# Patient Record
Sex: Female | Born: 1935 | Race: White | Hispanic: No
Health system: Southern US, Community
[De-identification: ages and names within clinical notes are randomized; demographics above are authoritative.]

---

## 2009-05-30 ENCOUNTER — Inpatient Hospital Stay: Payer: Self-pay | Admitting: Specialist

## 2010-06-17 ENCOUNTER — Emergency Department: Payer: Self-pay | Admitting: Emergency Medicine

## 2010-06-27 ENCOUNTER — Emergency Department: Payer: Self-pay

## 2011-06-16 ENCOUNTER — Emergency Department: Payer: Self-pay | Admitting: Unknown Physician Specialty

## 2011-10-11 ENCOUNTER — Emergency Department: Payer: Self-pay | Admitting: Emergency Medicine

## 2011-10-12 LAB — URINALYSIS, COMPLETE
Bacteria: NONE SEEN
Blood: NEGATIVE
Leukocyte Esterase: NEGATIVE
Nitrite: NEGATIVE
Ph: 5 (ref 4.5–8.0)
RBC,UR: 1 /HPF (ref 0–5)

## 2011-10-12 LAB — BASIC METABOLIC PANEL
Chloride: 106 mmol/L (ref 98–107)
Co2: 30 mmol/L (ref 21–32)
Osmolality: 289 (ref 275–301)
Potassium: 3.9 mmol/L (ref 3.5–5.1)
Sodium: 144 mmol/L (ref 136–145)

## 2011-10-12 LAB — CBC
MCHC: 32.7 g/dL (ref 32.0–36.0)
MCV: 96 fL (ref 80–100)
Platelet: 224 10*3/uL (ref 150–440)
RBC: 4.44 10*6/uL (ref 3.80–5.20)

## 2011-10-14 LAB — URINE CULTURE

## 2011-11-20 ENCOUNTER — Emergency Department: Payer: Self-pay | Admitting: *Deleted

## 2011-12-10 ENCOUNTER — Emergency Department: Payer: Self-pay | Admitting: Emergency Medicine

## 2011-12-10 LAB — URINALYSIS, COMPLETE
Bilirubin,UR: NEGATIVE
Blood: NEGATIVE
Glucose,UR: NEGATIVE mg/dL (ref 0–75)
Nitrite: POSITIVE
Ph: 7 (ref 4.5–8.0)
RBC,UR: 1 /HPF (ref 0–5)
Specific Gravity: 1.008 (ref 1.003–1.030)

## 2011-12-13 LAB — URINE CULTURE

## 2011-12-19 ENCOUNTER — Inpatient Hospital Stay: Payer: Self-pay | Admitting: Internal Medicine

## 2011-12-19 LAB — URINALYSIS, COMPLETE
Bilirubin,UR: NEGATIVE
Protein: 30
Specific Gravity: 1.021 (ref 1.003–1.030)
WBC UR: 666 /HPF (ref 0–5)

## 2011-12-19 LAB — CBC
HCT: 42.3 % (ref 35.0–47.0)
MCH: 31.6 pg (ref 26.0–34.0)
MCHC: 32.1 g/dL (ref 32.0–36.0)
MCV: 98 fL (ref 80–100)
Platelet: 195 10*3/uL (ref 150–440)

## 2011-12-19 LAB — COMPREHENSIVE METABOLIC PANEL
Albumin: 3.5 g/dL (ref 3.4–5.0)
Alkaline Phosphatase: 83 U/L (ref 50–136)
Bilirubin,Total: 0.8 mg/dL (ref 0.2–1.0)
Calcium, Total: 9.1 mg/dL (ref 8.5–10.1)
Chloride: 108 mmol/L — ABNORMAL HIGH (ref 98–107)
Creatinine: 0.83 mg/dL (ref 0.60–1.30)
EGFR (African American): >60
EGFR (Non-African Amer.): >60
Potassium: 4 mmol/L (ref 3.5–5.1)
SGOT(AST): 31 U/L (ref 15–37)
Sodium: 141 mmol/L (ref 136–145)

## 2011-12-19 LAB — CK TOTAL AND CKMB (NOT AT ARMC)
CK, Total: 111 U/L (ref 21–215)
CK-MB: 2.2 ng/mL (ref 0.5–3.6)

## 2011-12-19 LAB — PROTIME-INR: INR: 1

## 2011-12-20 LAB — CBC WITH DIFFERENTIAL/PLATELET
Basophil %: 0.5 %
Eosinophil #: 0.1 10*3/uL (ref 0.0–0.7)
Eosinophil %: 2.2 %
HGB: 12.9 g/dL (ref 12.0–16.0)
Lymphocyte %: 31.4 %
MCHC: 33.1 g/dL (ref 32.0–36.0)
MCV: 97 fL (ref 80–100)
Monocyte %: 9.2 %
Neutrophil #: 3.7 10*3/uL (ref 1.4–6.5)
Neutrophil %: 56.7 %
Platelet: 183 10*3/uL (ref 150–440)
RBC: 4.03 10*6/uL (ref 3.80–5.20)
RDW: 13.2 % (ref 11.5–14.5)
WBC: 6.6 10*3/uL (ref 3.6–11.0)

## 2011-12-20 LAB — BASIC METABOLIC PANEL
Calcium, Total: 8.9 mg/dL (ref 8.5–10.1)
Co2: 26 mmol/L (ref 21–32)
Glucose: 83 mg/dL (ref 65–99)
Osmolality: 293 (ref 275–301)
Sodium: 147 mmol/L — ABNORMAL HIGH (ref 136–145)

## 2011-12-22 LAB — CBC WITH DIFFERENTIAL/PLATELET
Basophil #: 0 10*3/uL (ref 0.0–0.1)
Basophil %: 0.5 %
Eosinophil #: 0.2 10*3/uL (ref 0.0–0.7)
Lymphocyte #: 1.6 10*3/uL (ref 1.0–3.6)
Lymphocyte %: 26.4 %
MCH: 31.9 pg (ref 26.0–34.0)
MCHC: 33.4 g/dL (ref 32.0–36.0)
MCV: 96 fL (ref 80–100)
Monocyte #: 0.6 x10 3/mm (ref 0.2–0.9)
Neutrophil #: 3.6 10*3/uL (ref 1.4–6.5)
Neutrophil %: 60.1 %
Platelet: 173 10*3/uL (ref 150–440)
RDW: 12.9 % (ref 11.5–14.5)
WBC: 6 10*3/uL (ref 3.6–11.0)

## 2011-12-22 LAB — BASIC METABOLIC PANEL
Calcium, Total: 8.5 mg/dL (ref 8.5–10.1)
Chloride: 111 mmol/L — ABNORMAL HIGH (ref 98–107)
Co2: 20 mmol/L — ABNORMAL LOW (ref 21–32)
EGFR (African American): >60
EGFR (Non-African Amer.): >60
Glucose: 70 mg/dL (ref 65–99)
Sodium: 144 mmol/L (ref 136–145)

## 2011-12-22 LAB — URINE CULTURE

## 2011-12-23 LAB — BASIC METABOLIC PANEL
Anion Gap: 10 (ref 7–16)
BUN: 10 mg/dL (ref 7–18)
Calcium, Total: 9.1 mg/dL (ref 8.5–10.1)
Chloride: 108 mmol/L — ABNORMAL HIGH (ref 98–107)
Creatinine: 0.69 mg/dL (ref 0.60–1.30)
EGFR (Non-African Amer.): >60
Glucose: 97 mg/dL (ref 65–99)
Osmolality: 282 (ref 275–301)
Sodium: 142 mmol/L (ref 136–145)

## 2011-12-23 LAB — MAGNESIUM: Magnesium: 1.9 mg/dL

## 2011-12-24 LAB — CULTURE, BLOOD (SINGLE)

## 2013-01-04 ENCOUNTER — Ambulatory Visit: Payer: Self-pay | Admitting: Nurse Practitioner

## 2013-02-04 ENCOUNTER — Ambulatory Visit: Payer: Self-pay | Admitting: Nurse Practitioner

## 2013-05-07 DEATH — deceased

## 2013-09-28 IMAGING — CR PELVIS - 1-2 VIEW
1 series · 1 of 1 positions shown · non-contrast
Comparison: None

REASON FOR EXAM: fall, left leg shorter than right.
COMMENTS:

PROCEDURE:     DXR - DXR PELVIS AP ONLY  - November 20, 2011  [DATE]
RESULT:     History: Fall

[t pelvis ap]
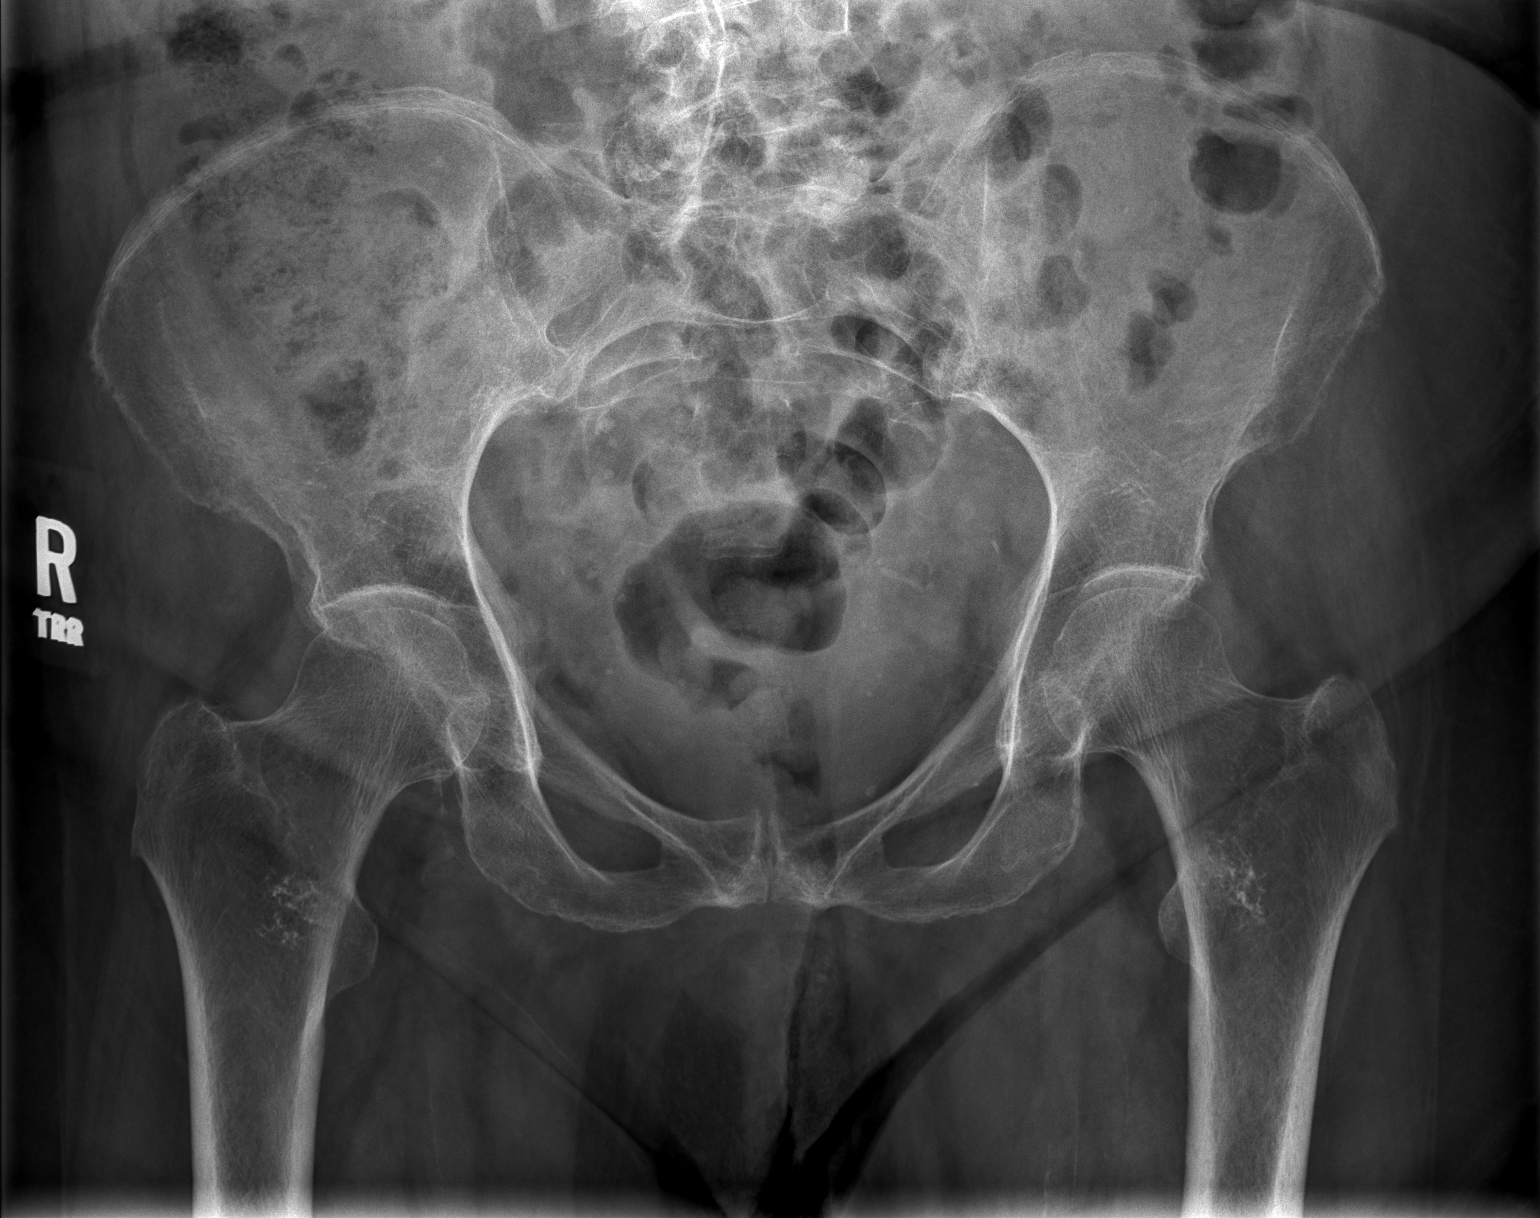

[1 of 1 positions shown; findings below may reference images not displayed]

FINDINGS: AP pelvis demonstrates no fracture or dislocation. The joint spaces are
maintained. The sacroiliac joints are unremarkable. There degenerative
changes of the pubic symphysis. There is generalized osteopenia.
IMPRESSION: No acute osseous injury of the pelvis. Given the patient's age and
osteopenia, if there is further clinical concern for a hip fracture, a
screening MRI of the hip is recommended for increased sensitivity.

## 2013-09-28 IMAGING — CT CT HEAD WITHOUT CONTRAST
2 series · 15 of 30 positions shown, 19 images · non-contrast
Comparison: none

REASON FOR EXAM: fall, hematoma to forehead
COMMENTS:

PROCEDURE:     CT  - CT HEAD WITHOUT CONTRAST  - November 20, 2011  [DATE]
RESULT:     Comparison:  None
TECHNIQUE: Multiple axial images from the foramen magnum to the vertex were
obtained without IV contrast.

[Series 2: without · axial · non-contrast · 0.44mm/px · z∈[-192,-62]mm · 13 of 32 slices shown, 17 images]
[im 3/32  brain]
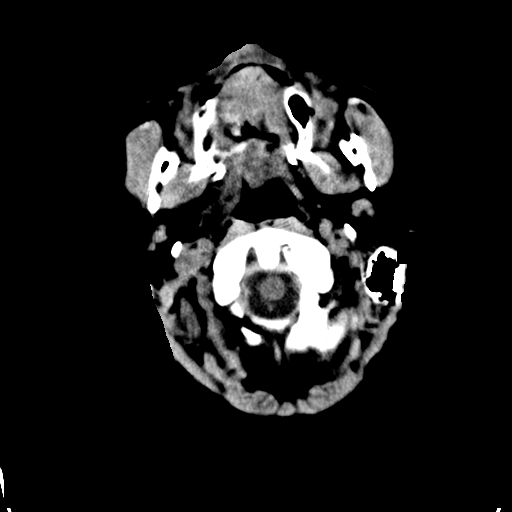
[im 3/32  bone]
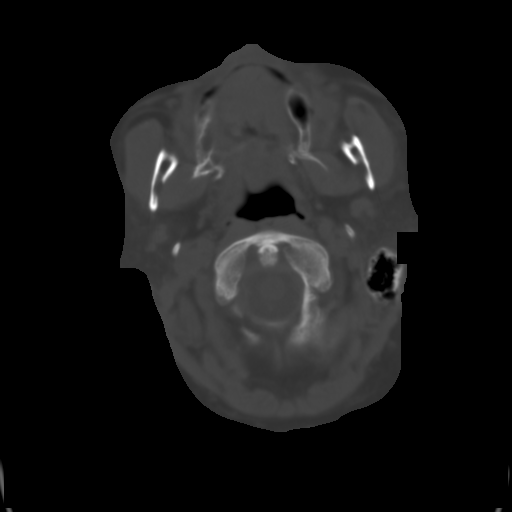
[im 5/32  brain]
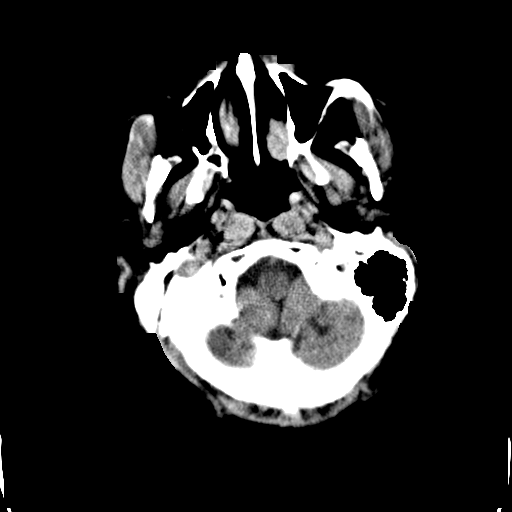
[im 7/32  brain]
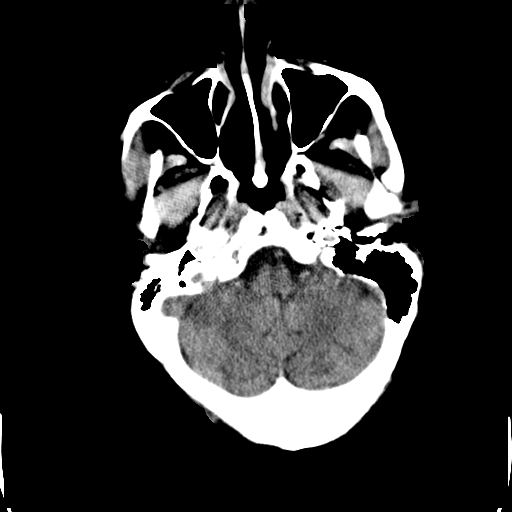
[im 9/32  brain]
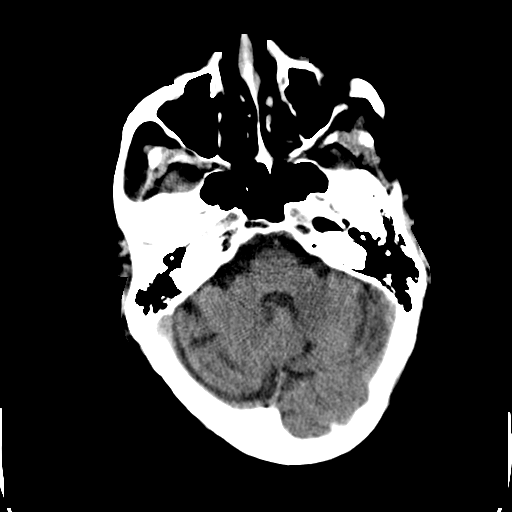
[im 12/32  brain]
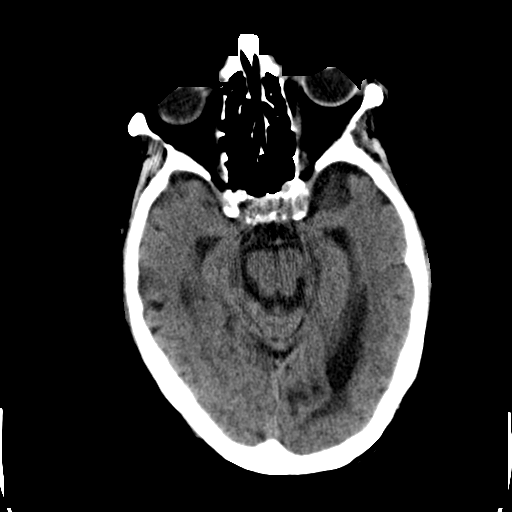
[im 12/32  bone]
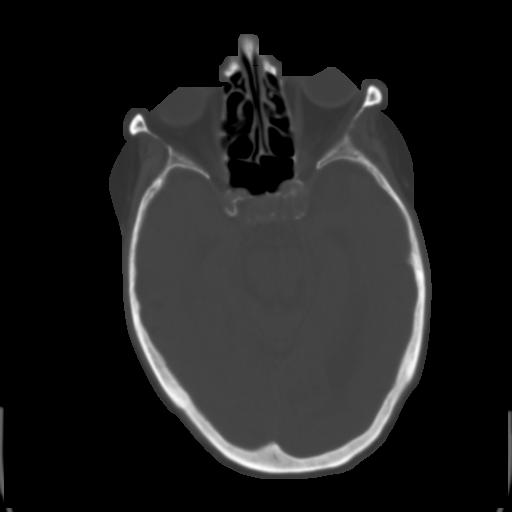
[im 14/32  brain]
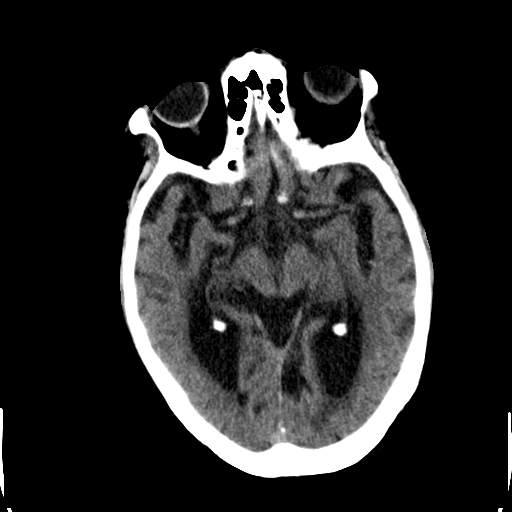
[im 16/32  brain]
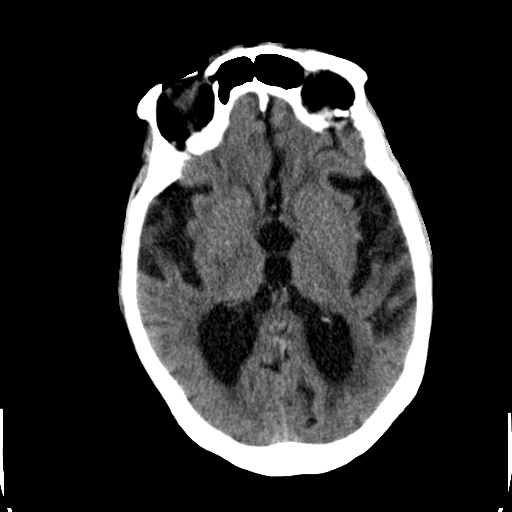
[im 18/32  brain]
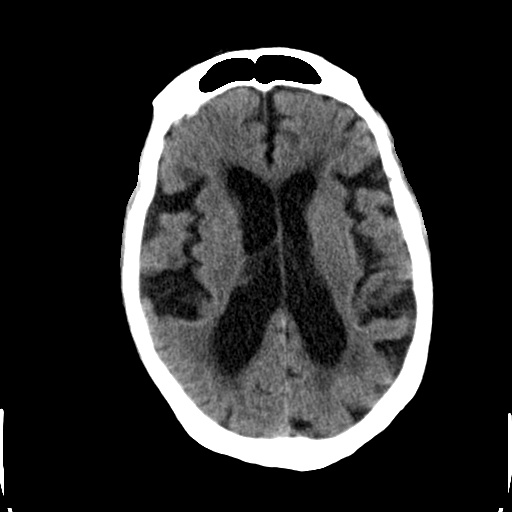
[im 20/32  brain]
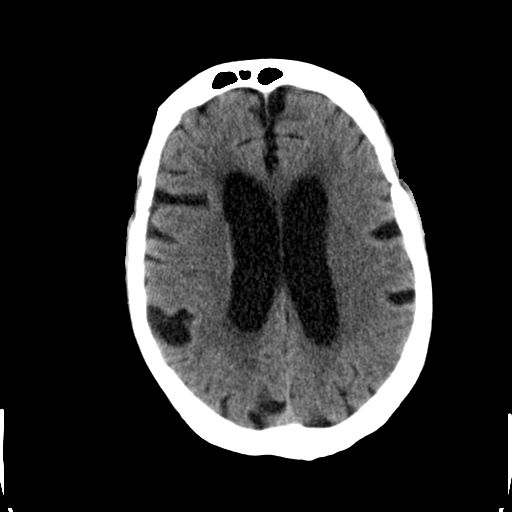
[im 20/32  bone]
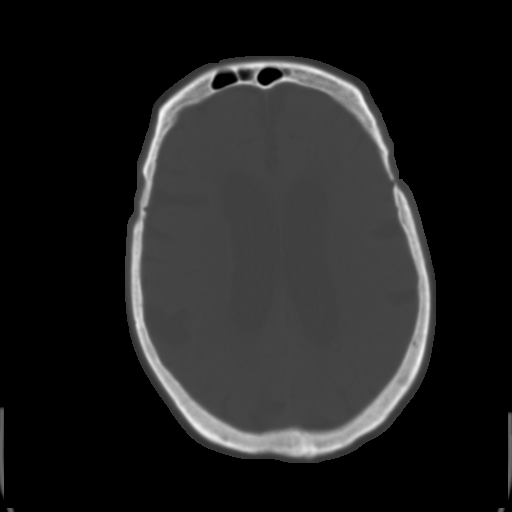
[im 23/32  brain]
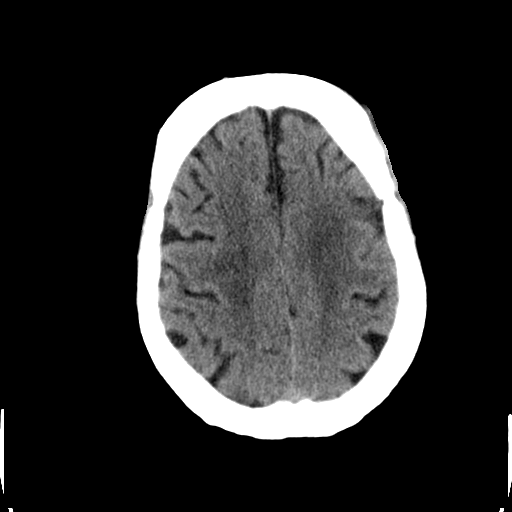
[im 25/32  brain]
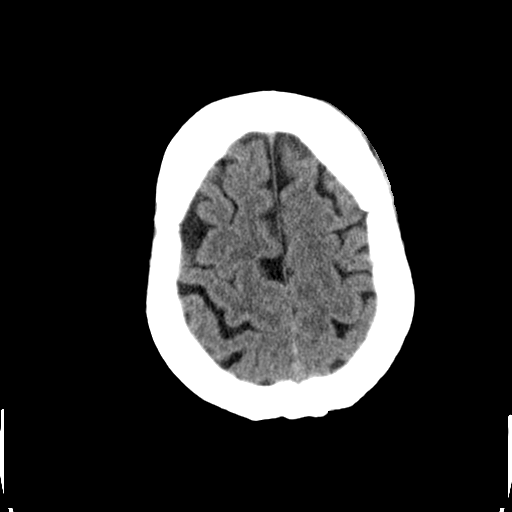
[im 27/32  brain]
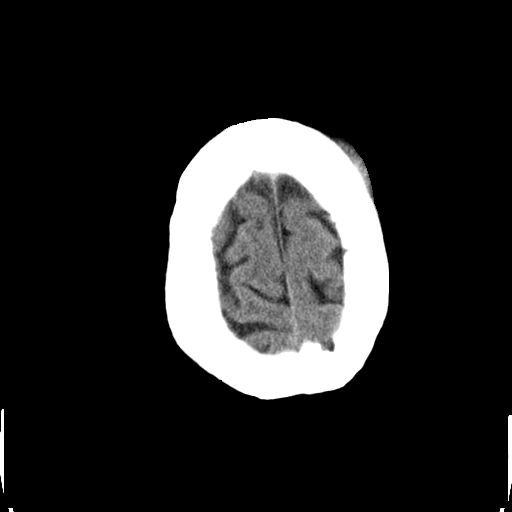
[im 29/32  brain]
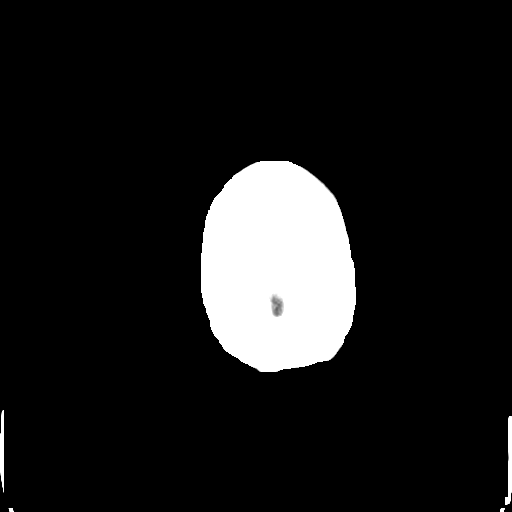
[im 29/32  bone]
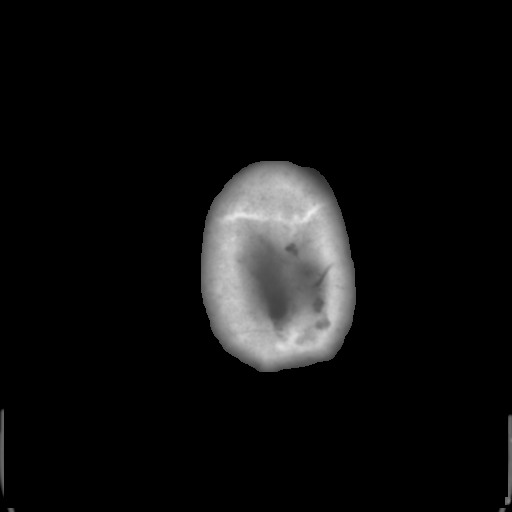

[Series 3: bone · axial · 0.44mm/px · z∈[-192,-172]mm · 2 of 32 slices shown]
[im 3/32  bone]
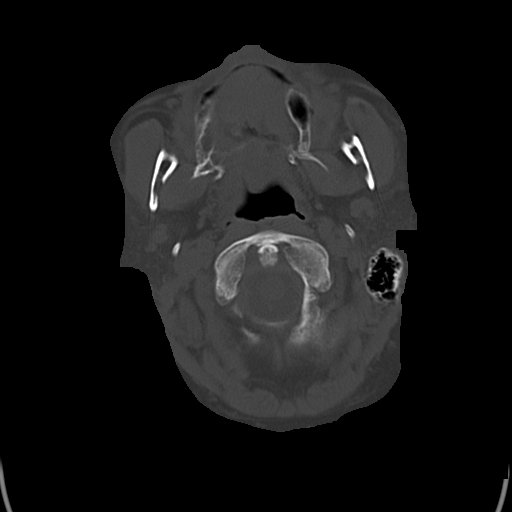
[im 7/32  bone]
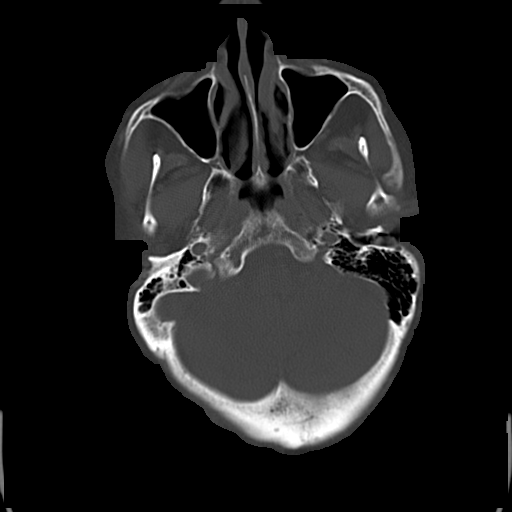

[15 of 30 positions shown; findings below may reference images not displayed]

FINDINGS: There is no evidence of mass effect, midline shift, or extra-axial fluid
collections.  There is no evidence of a space-occupying lesion or
intracranial hemorrhage. There is no evidence of a cortical-based area of
acute infarction. There is generalized cerebral atrophy. There is
periventricular white matter low attenuation likely secondary to
microangiopathy.

The ventricles and sulci are appropriate for the patient's age. The basal
cisterns are patent.

Visualized portions of the orbits are unremarkable. The visualized portions
of the paranasal sinuses and mastoid air cells are unremarkable.
Cerebrovascular atherosclerotic calcifications are noted.

The osseous structures are unremarkable. There is left frontal scalp
hematoma.
IMPRESSION: No acute intracranial process.

[REDACTED]

## 2013-09-28 IMAGING — CT CT CERVICAL SPINE WITHOUT CONTRAST
3 series · 16 of 33 positions shown, 19 images · non-contrast
Comparison: None

REASON FOR EXAM: fall
COMMENTS:

PROCEDURE:     CT  - CT CERVICAL SPINE WO  - November 20, 2011  [DATE]
RESULT:     Clinical Indication: Trauma
TECHNIQUE: Multiple axial CT images from the skull base to the mid vertebral
body of T1. obtained with sagittal and coronal reformatted images provided.

[Series 3: axial · axial · 0.33mm/px · z∈[-208,-86]mm · 8 of 74 slices shown, 10 images]
[im 6/74  soft-tissue]
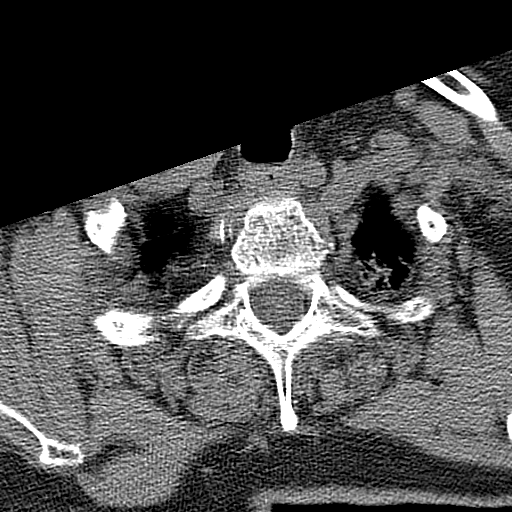
[im 6/74  bone]
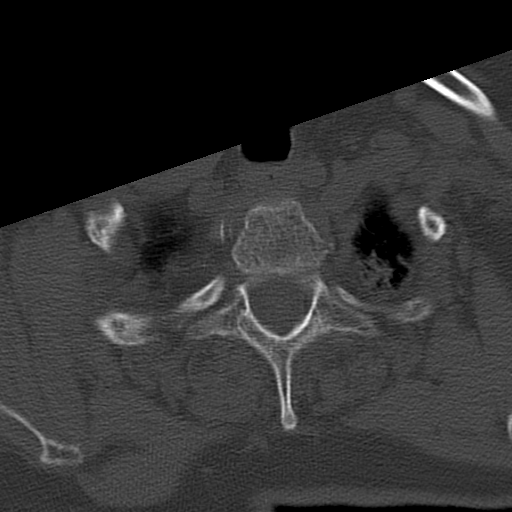
[im 17/74  bone]
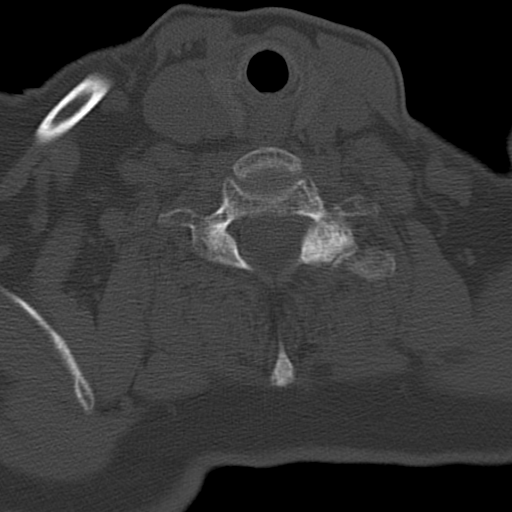
[im 23/74  bone]
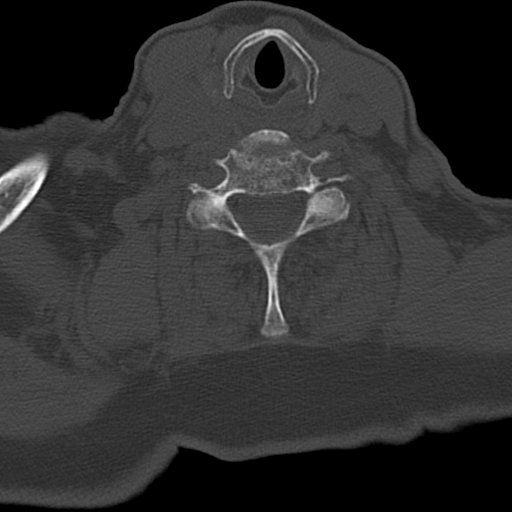
[im 34/74  bone]
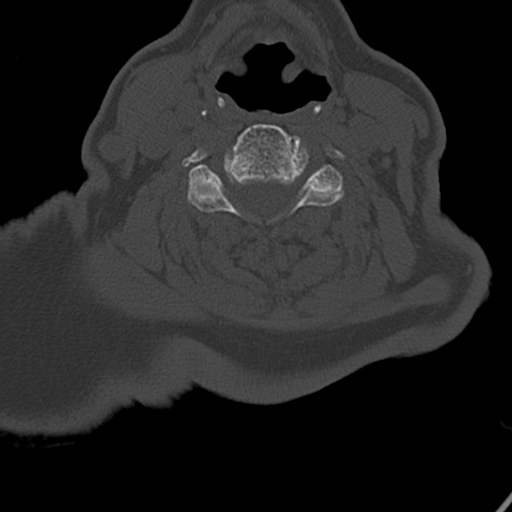
[im 40/74  soft-tissue]
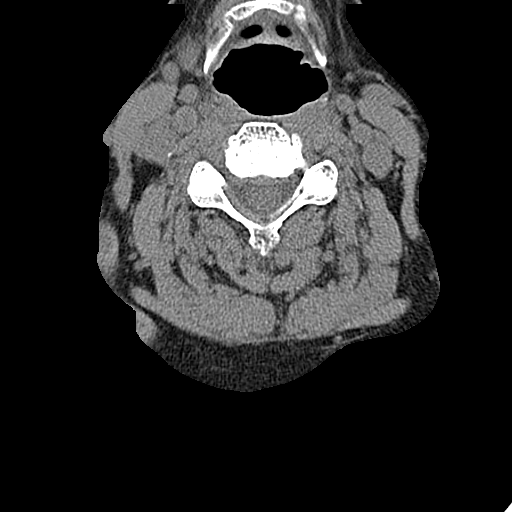
[im 40/74  bone]
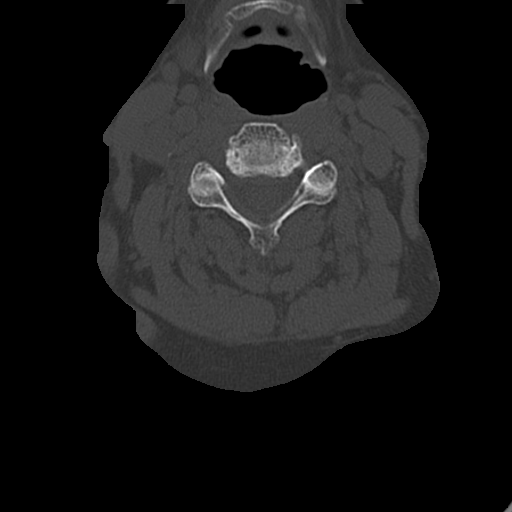
[im 51/74  bone]
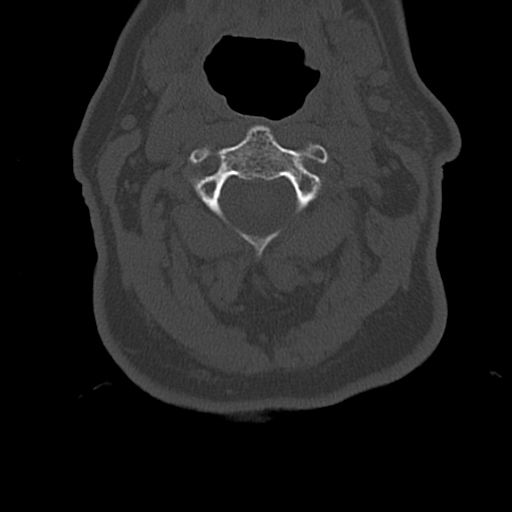
[im 57/74  bone]
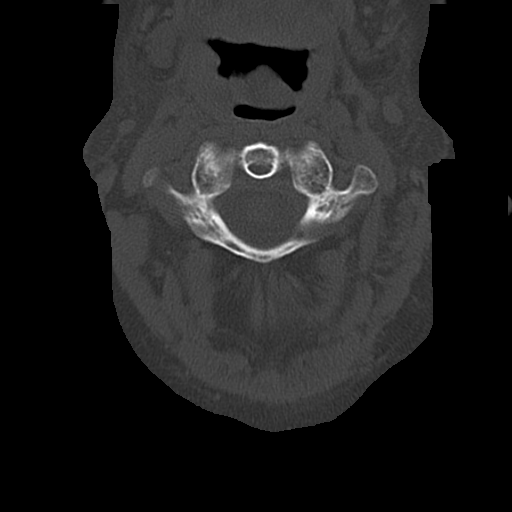
[im 68/74  bone]
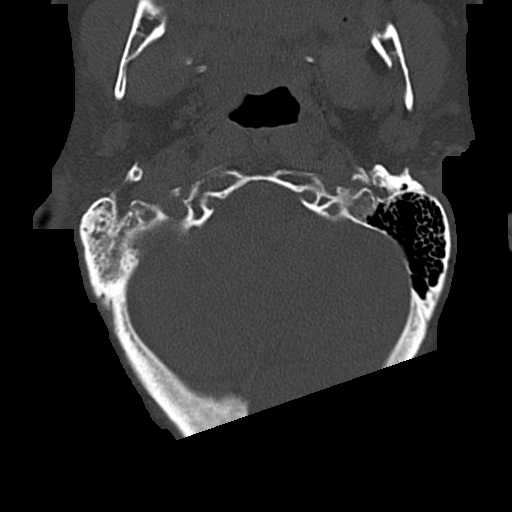

[Series 4: sagittal · sagittal · 0.33mm/px · 5 of 42 slices shown, 6 images]
[im 14/42  bone]
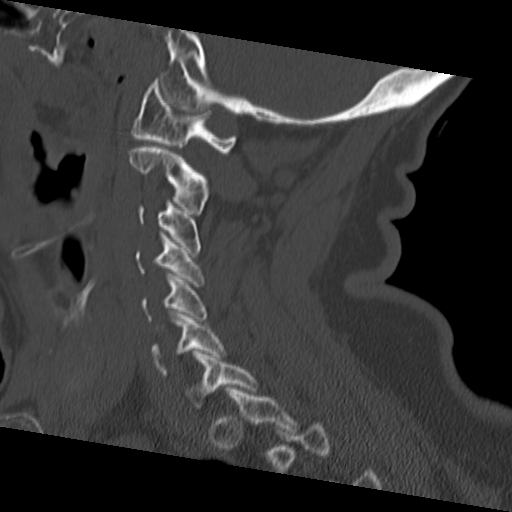
[im 18/42  bone]
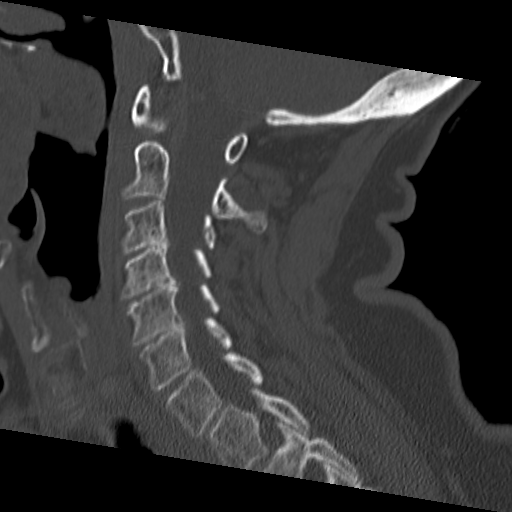
[im 21/42  soft-tissue]
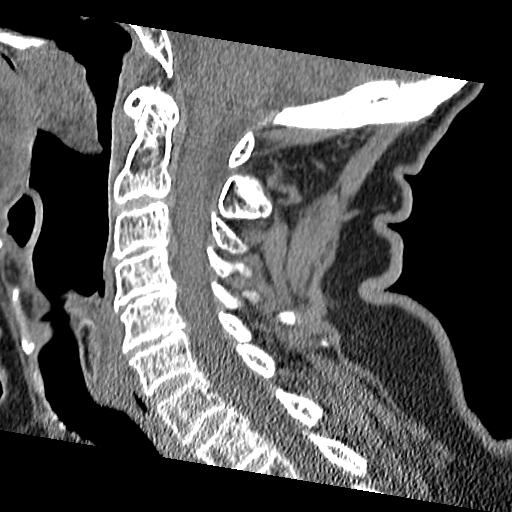
[im 21/42  bone]
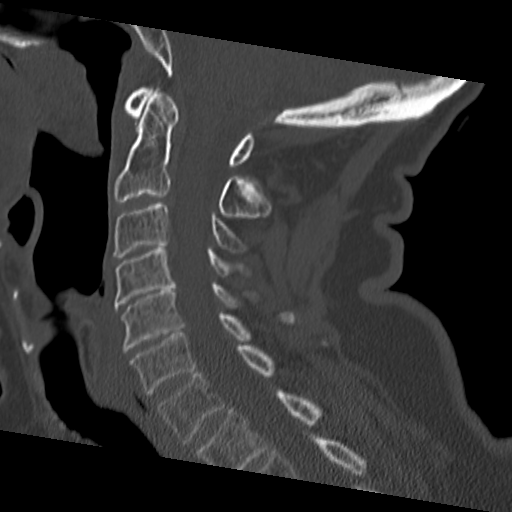
[im 24/42  bone]
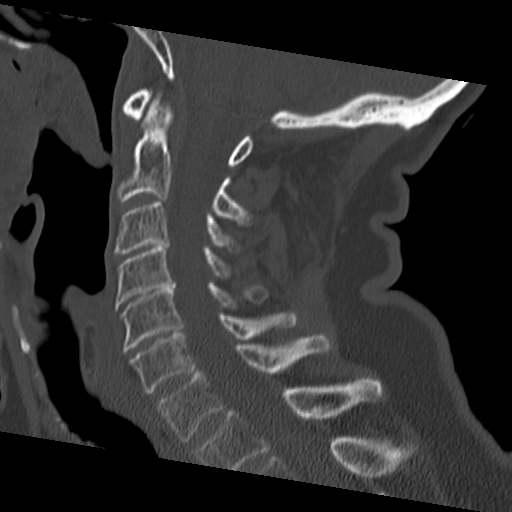
[im 28/42  bone]
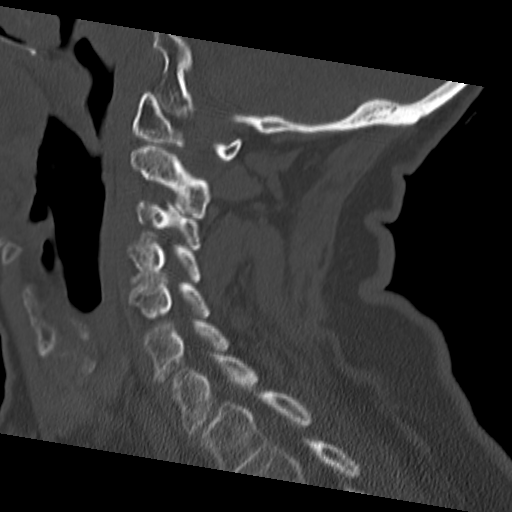

[Series 5: coronal · coronal · 0.33mm/px · 3 of 56 slices shown]
[im 12/56  bone]
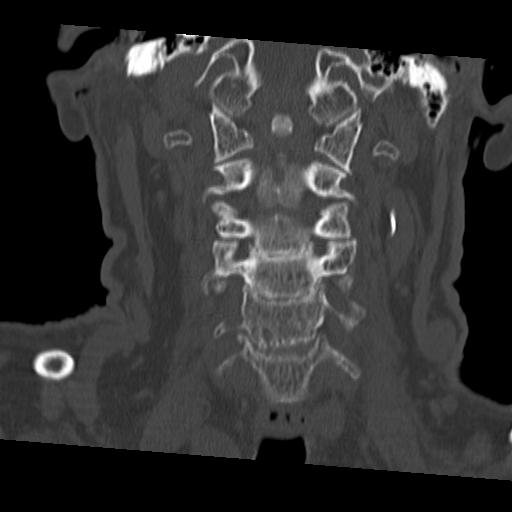
[im 23/56  bone]
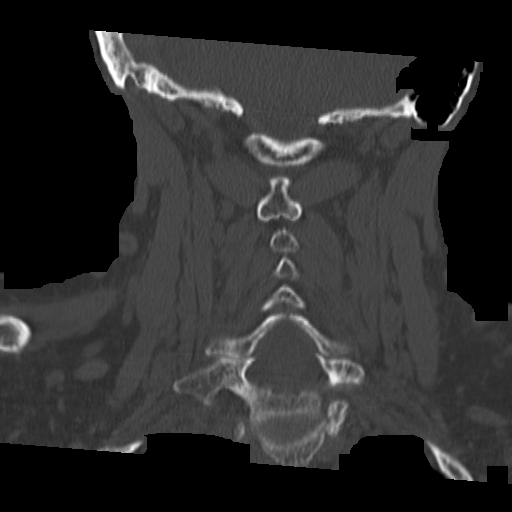
[im 34/56  bone]
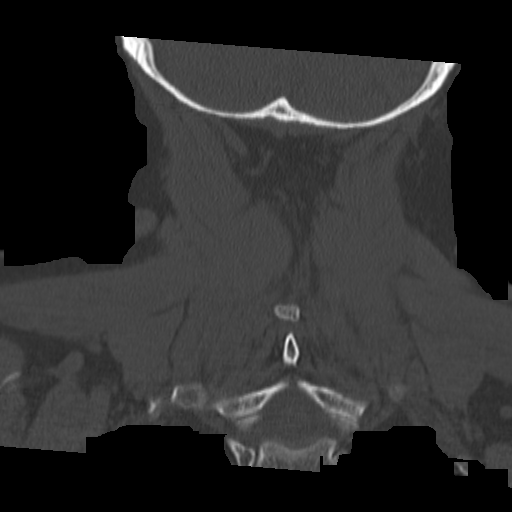

[16 of 33 positions shown; findings below may reference images not displayed]

FINDINGS: The alignment is anatomic. The vertebral body heights are maintained. There
is no acute fracture or static listhesis. The prevertebral soft tissues are
normal. The intraspinal soft tissues are not fully imaged on this
examination due to poor soft tissue contrast, but there is no soft tissue
gross abnormality.

There is degenerative disc disease of the cervical spine most significant at
C4-C5 and C5-C6. There is left facet arthropathy at C7-T1.

The visualized portions of the lung apices demonstrate no focal abnormality.
IMPRESSION: 1. No acute osseous injury of the cervical spine.

2. Ligamentous injury is not evaluated. If there is high clinical concern
for ligamentous injury, consider MRI or flexion/extension radiographs as
clinically indicated and tolerated.

[REDACTED]

## 2014-10-29 NOTE — Discharge Summary (Signed)
PATIENT NAME:  Jamie Lynch, Jamie Lynch MR#:  [id number] DATE OF BIRTH:  1924/11/30  DATE OF ADMISSION:  12/19/2011 DATE OF DISCHARGE:  12/23/2011   ADMISSION DIAGNOSIS: Altered mental status.   DISCHARGE DIAGNOSES:  1. Altered mental status secondary to dementia and Escherichia coli urinary tract infection.  2. Escherichia urinary tract infection.   3. Hypertension.  4. Anxiety.  5. Gastroesophageal reflux disease.   CONSULTS: None.   Marland Kitchen.LABORATORIES AT DISCHARGE: CT of the head on 12/22/2011 showed no evidence of acute intracranial hemorrhage or CVA.    Sodium 142, potassium 3.1, chloride 108, bicarb 24, BUN 10, creatinine 0.69, glucose 97. Magnesium 1.9. White blood cells 6, hemoglobin 12.7, hematocrit 38, platelets 173. Urine culture showed Escherichia coli pansensitive to most antibiotics. Blood cultures were negative to date.   HOSPITAL COURSE: The patient is an 81 year old female with severe dementia who presented with altered mental status. For further details, please refer to the history and physical.  1. Altered mental status secondary to Escherichia coli urinary tract infection as well as ongoing worsening dementia. The patient was admitted to the hospitalist service. She was started on IV Rocephin. This was changed to Levaquin per sensitivities. She had some waxing and waning of her dementia which was discussed with the son. We did repeat a CT to rule out a stroke and this was negative.  2. Escherichia urinary tract infection. Cultures were pansensitive to most antibiotics. She will continue on Levaquin for two weeks and then we recommended a urinalysis after the course of treatment.  3. Hypertension. The patient's p.o. meds may be crushed in applesauce.  4. Anxiety. The patient will continue on her clonazepam.   DISCHARGE MEDICATIONS:  1. Multivitamins one tab a day.  2. Aspirin 81 mg daily.  3. Clonazepam 0.5 mg at bedtime.  4. Norvasc 5 mg daily.  5. Acetaminophen 325 2 tablets b.i.d.  p.r.n.  6. Levaquin 250 mg daily.   DISCHARGE DIET: Pureed, aspiration precautions.   DISCHARGE ACTIVITY: As tolerated.   DISCHARGE REFERRALS: Physical therapy and speech.   DISCHARGE FOLLOW-UP: The patient can follow-up with her primary care physician in 1 to 2 weeks, Dr. Francena [facility]Stella Susac [facility].   TIME SPENT: 35 minutes.   ____________________________ Janyth ContesSital P. Juliene PinaMody, MD spm:drc D: 12/23/2011 12:01:57 ET T: 12/23/2011 12:15:31 ET JOB#: [id number]  cc: Suellen Durocher P. Juliene PinaMody, MD, <Dictator> Dr. Francena HanlyStella Susac  Patricia PesaSITAL P Serinity Ware MD ELECTRONICALLY SIGNED 12/23/2011 13:52

## 2014-10-29 NOTE — H&P (Signed)
PATIENT NAME:  Jamie Lynch, Jamie Lynch MR#:  [id number] DATE OF BIRTH:  1925/04/23  DATE OF ADMISSION:  12/19/2011  ADMITTING PHYSICIAN: Enid Baasadhika Donnivan Villena, MD   PRIMARY CARE PHYSICIAN: Dr. Thelma [facility]ge[facility]ella [facility]   CHIEF COMPLAINT: Mental status.   HISTORY OF PRESENT ILLNESS: Ms. Jamie Lynch is an 81 year old elderly Caucasian female with past medical history significant for severe dementia, hypertension, osteoporosis, who was brought from [facility] for confusion, decreased p.o. intake, unable to take oral medications for two days now. The patient opens her eyes in bed, cannot provide any history. Most of the history is obtained from the son at bedside. According to him, the patient was having some decreased p.o. intake on Wednesday, which is two days from now, and they checked a urinalysis and started her on Macrobid for possible urinary tract infection. She did take her medications in the morning, but since yesterday her intake has been very poor and she is not taking her oral medications. She is more confused, not talking at all, and very sleepy. Her son was concerned and brought her to the hospital. Her labs look okay other than urine shows florid urinary tract infection, so she is being admitted for IV antibiotics and metabolic encephalopathy.   PAST MEDICAL HISTORY:  1. Hypertension.  2. Dementia.  3. Osteoporosis.  4. Bilateral lower extremity edema.   PAST SURGICAL HISTORY: She did have an incarcerated left inguinal hernia repair in November 2010. Other surgeries are not known.   ALLERGIES: No known drug allergies.   MEDICATIONS:  1. Acetaminophen 325 to 650 mg p.o. b.i.d. p.r.n.  2. Amlodipine 5 mg p.o. daily.  3. Aspirin 81 mg p.o. daily. 4. Klonopin 0.5 mg p.o. at bedtime.  5. Famotidine 20 mg p.o. b.i.d.   6. Lasix 20 mg p.o. daily.  7. K-Dur 10 mEq p.o. daily.  8. Macrobid 100 mg p.o. b.i.d. started two days ago.  9. Multivitamin 1 tablet p.o.  daily.  10. Ambien 5 mg p.o. at bedtime.    SOCIAL HISTORY: She is a resident [facility]. No smoking or alcohol use.   FAMILY HISTORY: Family history is not obtainable secondary to the patient's dementia, and son is unaware of any medical problems running in the family.   REVIEW OF SYSTEMS: Review of systems is difficult to be obtained secondary to the patient's severe dementia.   PHYSICAL EXAMINATION:  VITAL SIGNS: Temperature 99.3 degrees Fahrenheit, pulse 73, respirations 18, blood pressure 128/63, pulse oximetry 99% on room air.   GENERAL: An elderly female lying in bed, not in any acute distress.   HEENT: Normocephalic, atraumatic. Pupils are equal, round, reacting to light. Anicteric sclerae. Extraocular movements are intact. Oropharynx clear without erythema, mass, or exudates. Very dry mucous membranes.   NECK: Supple. No thyromegaly, JVD or carotid bruits. No lymphadenopathy.   LUNGS: Moving air bilaterally. No wheeze or crackles. No use of accessory muscles for breathing.   CARDIOVASCULAR: S1, S2 regular rate and rhythm. No murmurs, rubs, or gallops.   ABDOMEN: Soft, nontender, nondistended. No hepatosplenomegaly. Normal bowel sounds.   EXTREMITIES: No pedal edema. No clubbing or cyanosis. Feeble dorsalis pedis pulses palpable bilaterally.   SKIN: No acne, rash, or lesion. She does have some bruises from falling, mostly on the hands around the wrist.    NEUROLOGICAL: Unable to do a neurological assessment secondary to the patient's encephalopathy. She does not have any obvious fascial droop. Unable to move  both upper extremities. She is weaker in bilateral lower extremities. She has chronic lower extremity  edema and is  pretty much bedbound and wheelchair bound.   PSYCHOLOGICAL: The patient is sleepy, easily arousable, but not alert oriented.   LABORATORY, DIAGNOSTIC AND RADIOLOGICAL DATA:  WBC 7.8, hemoglobin 13.6, hematocrit 42.3, platelet count  195.  Sodium 141, potassium 4.0, chloride 108, bicarbonate 24, BUN 19, creatinine 0.83, glucose 103, calcium 9.1  ALT 21, AST 31, alkaline phosphatase 83, total bilirubin 0.8, and albumin of 3.5. CK 111, CK-MB 2.2, and troponin less than 0.02.  INR 1.0.  ABG showing pH of 7.45, pCO2 35, pO2 of 81, and bicarbonate of 24.3. Saturations are 96% on room air.  Urinalysis with positive nitrite, 2+ esterase, 666 WBCs and 2+ bacteria.  CT of the head without contrast showing no acute intracranial process. 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 Generalized cerebral atrophy is present.  Chest x-ray showing shallow inspiration. No evidence of any acute cardiopulmonary disease.  EKG: Sinus rhythm, heart rate of 79, no ST-T wave abnormalities are present.   ASSESSMENT AND PLAN: The patient is an elderly 81 year old female with history of dementia, hypertension, brought in from [facility], admitted for urinary tract infection and metabolic encephalopathy.   1. Metabolic encephalopathy: Likely cause could have been urinary tract infection. Blood and urine cultures. We will start her on Rocephin for now. CT of the head is negative at this point. No other causes could be found.  2. Urinary tract infection: Urine cultures and blood cultures, on Rocephin for now.  3. Hypertension: Since p.o. medications are on hold secondary to mental status, we will start IV hydralazine p.r.n.  4. Anxiety: Since the patient is drowsy, hold Klonopin for now.  5. Gastroesophageal reflux disease: Change her ranitidine to Protonix b.i.d. as she is n.p.o.   CODE STATUS: DO NOT RESUSCITATE.  I have discussed with son at bedside, and he says this was already discussed in the past, and the patient does have a DO NOT RESUSCITATE form signed which is not present at this time.   TIME SPENT ON ADMISSION: 50 minutes.   ____________________________ Enid Baas,  MD rk:cbb D: 12/19/2011 12:34:31 ET T: 12/19/2011 13:18:25 ET JOB#: [id number]  cc: Enid Baas, MD, <Dictator> [facility] Enid Baas MD ELECTRONICALLY SIGNED 12/19/2011 15:37
